# Patient Record
Sex: Female | Born: 1970 | Hispanic: Yes | Smoking: Never smoker
Health system: Southern US, Community
[De-identification: ages and names within clinical notes are randomized; demographics above are authoritative.]

## PROBLEM LIST (undated history)

## (undated) DIAGNOSIS — L709 Acne, unspecified: Secondary | ICD-10-CM

## (undated) HISTORY — DX: Acne, unspecified: L70.9

## (undated) HISTORY — PX: SALPINGOOPHORECTOMY: SHX82

## (undated) HISTORY — PX: AUGMENTATION MAMMAPLASTY: SUR837

## (undated) HISTORY — PX: TONSILLECTOMY: SUR1361

---

## 2009-05-04 ENCOUNTER — Ambulatory Visit: Payer: Self-pay | Admitting: Otolaryngology

## 2009-06-23 ENCOUNTER — Ambulatory Visit: Payer: Self-pay | Admitting: Otolaryngology

## 2010-02-15 ENCOUNTER — Ambulatory Visit: Payer: Self-pay | Admitting: Obstetrics and Gynecology

## 2010-02-25 ENCOUNTER — Ambulatory Visit: Payer: Self-pay | Admitting: Obstetrics and Gynecology

## 2010-06-28 ENCOUNTER — Ambulatory Visit: Payer: Self-pay | Admitting: General Surgery

## 2010-06-29 LAB — PATHOLOGY REPORT

## 2010-07-03 HISTORY — PX: BREAST EXCISIONAL BIOPSY: SUR124

## 2010-12-20 ENCOUNTER — Ambulatory Visit: Payer: Self-pay | Admitting: Family Medicine

## 2011-03-08 ENCOUNTER — Ambulatory Visit: Payer: Self-pay | Admitting: General Surgery

## 2014-12-22 ENCOUNTER — Ambulatory Visit: Payer: 59 | Attending: Internal Medicine

## 2014-12-22 DIAGNOSIS — R0683 Snoring: Secondary | ICD-10-CM | POA: Insufficient documentation

## 2014-12-22 DIAGNOSIS — R0681 Apnea, not elsewhere classified: Secondary | ICD-10-CM | POA: Diagnosis not present

## 2014-12-22 DIAGNOSIS — R400 Somnolence: Secondary | ICD-10-CM | POA: Diagnosis present

## 2016-05-29 ENCOUNTER — Other Ambulatory Visit: Payer: Self-pay | Admitting: Obstetrics & Gynecology

## 2016-05-29 DIAGNOSIS — Z1231 Encounter for screening mammogram for malignant neoplasm of breast: Secondary | ICD-10-CM

## 2016-05-29 LAB — HM PAP SMEAR: HM PAP: NEGATIVE

## 2016-05-30 ENCOUNTER — Other Ambulatory Visit: Payer: Self-pay | Admitting: Obstetrics & Gynecology

## 2016-05-30 ENCOUNTER — Ambulatory Visit
Admission: RE | Admit: 2016-05-30 | Discharge: 2016-05-30 | Disposition: A | Discharge status: Home / Self Care | Payer: 59 | Source: Ambulatory Visit | Attending: Obstetrics & Gynecology | Admitting: Obstetrics & Gynecology

## 2016-05-30 DIAGNOSIS — Z1231 Encounter for screening mammogram for malignant neoplasm of breast: Secondary | ICD-10-CM | POA: Insufficient documentation

## 2016-06-05 ENCOUNTER — Other Ambulatory Visit: Payer: Self-pay | Admitting: *Deleted

## 2016-06-05 ENCOUNTER — Inpatient Hospital Stay
Admission: RE | Admit: 2016-06-05 | Discharge: 2016-06-05 | Disposition: A | Discharge status: Home / Self Care | Payer: Self-pay | Source: Ambulatory Visit | Attending: *Deleted | Admitting: *Deleted

## 2016-06-05 DIAGNOSIS — Z9289 Personal history of other medical treatment: Secondary | ICD-10-CM

## 2016-07-26 DIAGNOSIS — Z0000 Encounter for general adult medical examination without abnormal findings: Secondary | ICD-10-CM | POA: Diagnosis not present

## 2016-08-08 DIAGNOSIS — Z0000 Encounter for general adult medical examination without abnormal findings: Secondary | ICD-10-CM | POA: Diagnosis not present

## 2016-08-08 DIAGNOSIS — E781 Pure hyperglyceridemia: Secondary | ICD-10-CM | POA: Diagnosis not present

## 2016-08-08 DIAGNOSIS — M545 Low back pain: Secondary | ICD-10-CM | POA: Diagnosis not present

## 2016-08-08 DIAGNOSIS — R7303 Prediabetes: Secondary | ICD-10-CM | POA: Diagnosis not present

## 2016-09-19 DIAGNOSIS — H43311 Vitreous membranes and strands, right eye: Secondary | ICD-10-CM | POA: Diagnosis not present

## 2016-09-19 DIAGNOSIS — H40003 Preglaucoma, unspecified, bilateral: Secondary | ICD-10-CM | POA: Diagnosis not present

## 2016-10-30 DIAGNOSIS — Z79899 Other long term (current) drug therapy: Secondary | ICD-10-CM | POA: Diagnosis not present

## 2016-10-30 DIAGNOSIS — L718 Other rosacea: Secondary | ICD-10-CM | POA: Diagnosis not present

## 2016-11-16 DIAGNOSIS — H6062 Unspecified chronic otitis externa, left ear: Secondary | ICD-10-CM | POA: Diagnosis not present

## 2016-11-16 DIAGNOSIS — J301 Allergic rhinitis due to pollen: Secondary | ICD-10-CM | POA: Diagnosis not present

## 2016-11-16 DIAGNOSIS — R0683 Snoring: Secondary | ICD-10-CM | POA: Diagnosis not present

## 2016-12-12 ENCOUNTER — Other Ambulatory Visit: Payer: Self-pay | Admitting: Obstetrics & Gynecology

## 2017-01-08 DIAGNOSIS — Z79899 Other long term (current) drug therapy: Secondary | ICD-10-CM | POA: Diagnosis not present

## 2017-01-08 DIAGNOSIS — L718 Other rosacea: Secondary | ICD-10-CM | POA: Diagnosis not present

## 2017-02-12 DIAGNOSIS — M545 Low back pain: Secondary | ICD-10-CM | POA: Diagnosis not present

## 2017-02-12 DIAGNOSIS — E781 Pure hyperglyceridemia: Secondary | ICD-10-CM | POA: Diagnosis not present

## 2017-02-12 DIAGNOSIS — R7303 Prediabetes: Secondary | ICD-10-CM | POA: Diagnosis not present

## 2017-05-29 DIAGNOSIS — M19072 Primary osteoarthritis, left ankle and foot: Secondary | ICD-10-CM | POA: Diagnosis not present

## 2017-05-29 DIAGNOSIS — M79672 Pain in left foot: Secondary | ICD-10-CM | POA: Diagnosis not present

## 2017-05-30 ENCOUNTER — Encounter: Payer: Self-pay | Admitting: Obstetrics & Gynecology

## 2017-05-30 ENCOUNTER — Ambulatory Visit (INDEPENDENT_AMBULATORY_CARE_PROVIDER_SITE_OTHER): Payer: 59 | Admitting: Obstetrics & Gynecology

## 2017-05-30 VITALS — BP 128/30 | HR 82 | Ht 66.0 in | Wt 203.0 lb

## 2017-05-30 DIAGNOSIS — Z23 Encounter for immunization: Secondary | ICD-10-CM

## 2017-05-30 DIAGNOSIS — Z1231 Encounter for screening mammogram for malignant neoplasm of breast: Secondary | ICD-10-CM

## 2017-05-30 DIAGNOSIS — Z124 Encounter for screening for malignant neoplasm of cervix: Secondary | ICD-10-CM

## 2017-05-30 DIAGNOSIS — N812 Incomplete uterovaginal prolapse: Secondary | ICD-10-CM

## 2017-05-30 DIAGNOSIS — Z1239 Encounter for other screening for malignant neoplasm of breast: Secondary | ICD-10-CM

## 2017-05-30 DIAGNOSIS — Z01419 Encounter for gynecological examination (general) (routine) without abnormal findings: Secondary | ICD-10-CM | POA: Diagnosis not present

## 2017-05-30 DIAGNOSIS — Z0000 Encounter for general adult medical examination without abnormal findings: Secondary | ICD-10-CM

## 2017-05-30 MED ORDER — YAZ 3-0.02 MG PO TABS: 1.0000 | ORAL_TABLET | Freq: Every day | ORAL | 3 refills | Status: DC

## 2017-05-30 NOTE — Patient Instructions (Signed)
PAP every three years Mammogram every year    Call [phone] to schedule at [facility] every 10 years after age 47 Labs yearly (with PCP) 

## 2017-05-30 NOTE — Addendum Note (Signed)
Addended by: Nadara MustardHARRIS, Essynce Munsch P on: 05/30/2017 02:58 PM   Modules accepted: Orders

## 2017-05-30 NOTE — Progress Notes (Signed)
HPI:      [name] is a 46 y.o. [phone] who LMP was No LMP recorded. Patient is not currently having periods (Reason: Oral contraceptives)., she presents today for her annual examination. The patient has no complaints today. The patient is sexually active. Her last pap: approximate date 2014 and was abnormal: ASCUS and last 3 PAPs have been normal and last mammogram: approximate date 2017 and was normal. The patient does perform self breast exams.  There is no notable family history of breast or ovarian cancer in her family.  The patient has regular exercise: yes.  The patient denies current symptoms of depression.    GYN History: Contraception: OCP (estrogen/progesterone)  PMHx: Past Medical History:  Diagnosis Date  . Acne   . Breast mass    Past Surgical History:  Procedure Laterality Date  . BREAST LUMPECTOMY    . SALPINGOOPHORECTOMY    . TONSILLECTOMY     Family History  Problem Relation Age of Onset  . Breast cancer Neg Hx    Social History   Tobacco Use  . Smoking status: Never Smoker  . Smokeless tobacco: Never Used  Substance Use Topics  . Alcohol use: No    Frequency: Never  . Drug use: No    Current Outpatient Medications:  .  YAZ 3-0.02 MG tablet, TAKE 1 TABLET BY MOUTH ONCE DAILY, Disp: 84 tablet, Rfl: 2 Allergies: Vicodin [hydrocodone-acetaminophen]  Review of Systems  Constitutional: Negative for chills, fever and malaise/fatigue.  HENT: Negative for congestion, sinus pain and sore throat.   Eyes: Negative for blurred vision and pain.  Respiratory: Negative for cough and wheezing.   Cardiovascular: Negative for chest pain and leg swelling.  Gastrointestinal: Negative for abdominal pain, constipation, diarrhea, heartburn, nausea and vomiting.  Genitourinary: Negative for dysuria, frequency, hematuria and urgency.  Musculoskeletal: Negative for back pain, joint pain, myalgias and neck pain.  Skin: Negative for itching and rash.  Neurological:  Negative for dizziness, tremors and weakness.  Endo/Heme/Allergies: Does not bruise/bleed easily.  Psychiatric/Behavioral: Negative for depression. The patient is not nervous/anxious and does not have insomnia.     Objective: BP (!) 128/30   Pulse 82   Ht 5\' 6"  (1.676 m)   Wt 203 lb (92.1 kg)   BMI 32.77 kg/m   Filed Weights   05/30/17 1417  Weight: 203 lb (92.1 kg)   Body mass index is 32.77 kg/m. Physical Exam  Constitutional: She is oriented to person, place, and time. She appears well-developed and well-nourished. No distress.  Genitourinary: Rectum normal, vagina normal and uterus normal. Pelvic exam was performed with patient supine. There is no rash or lesion on the right labia. There is no rash or lesion on the left labia. Vagina exhibits no lesion. No bleeding in the vagina. Right adnexum does not display mass and does not display tenderness. Left adnexum does not display mass and does not display tenderness. Cervix does not exhibit motion tenderness, lesion, friability or polyp.   Uterus is mobile and midaxial. Uterus is not enlarged or exhibiting a mass.  Genitourinary Comments: Gr 2 prolapse and Gr 2 cystocele  HENT:  Head: Normocephalic and atraumatic. Head is without laceration.  Right Ear: Hearing normal.  Left Ear: Hearing normal.  Nose: No epistaxis.  No foreign bodies.  Mouth/Throat: Uvula is midline, oropharynx is clear and moist and mucous membranes are normal.  Eyes: Pupils are equal, round, and reactive to light.  Neck: Normal range of motion. Neck supple.  No thyromegaly present.  Cardiovascular: Normal rate and regular rhythm. Exam reveals no gallop and no friction rub.  No murmur heard. Pulmonary/Chest: Effort normal and breath sounds normal. No respiratory distress. She has no wheezes. Right breast exhibits no mass, no skin change and no tenderness. Left breast exhibits no mass, no skin change and no tenderness.  Abdominal: Soft. Bowel sounds are normal. She  exhibits no distension. There is no tenderness. There is no rebound.  Musculoskeletal: Normal range of motion.  Neurological: She is alert and oriented to person, place, and time. No cranial nerve deficit.  Skin: Skin is warm and dry.  Psychiatric: She has a normal mood and affect. Judgment normal.  Vitals reviewed.  Assessment:  1. Annual physical exam   2. Screening for cervical cancer    Screening Plan:            1.  Cervical Screening-  Pap smear done today; prior ASCUS 2014  2. Breast screening- Exam annually and mammogram>40 planned   3. Colonoscopy every 10 years, Hemoccult testing - after age 47  4. Labs managed by PCP  5. Counseling for contraception: oral contraceptives (estrogen/progesterone)   6. Prolapse.  Monitor for sx's.  Surgery options dicussed    F/U  Return in about 1 year (around 05/30/2018) for Annual.  Annamarie MajorPaul Aldine Grainger, MD, Merlinda FrederickFACOG [facility], [facility] 05/30/2017  2:26 PM

## 2017-06-02 LAB — IGP, APTIMA HPV
HPV Aptima: POSITIVE — AB
PAP Smear Comment: 0

## 2017-06-03 ENCOUNTER — Encounter: Payer: Self-pay | Admitting: Obstetrics & Gynecology

## 2017-07-11 DIAGNOSIS — L918 Other hypertrophic disorders of the skin: Secondary | ICD-10-CM | POA: Diagnosis not present

## 2017-07-11 DIAGNOSIS — L719 Rosacea, unspecified: Secondary | ICD-10-CM | POA: Diagnosis not present

## 2017-08-01 DIAGNOSIS — E781 Pure hyperglyceridemia: Secondary | ICD-10-CM | POA: Diagnosis not present

## 2017-08-01 DIAGNOSIS — R7303 Prediabetes: Secondary | ICD-10-CM | POA: Diagnosis not present

## 2017-08-02 ENCOUNTER — Telehealth: Payer: Self-pay

## 2017-08-02 NOTE — Telephone Encounter (Signed)
Pt gets rx filled thru [facility], but she is headed out of town for the weekend & is inquiring if her rx can be sent to a local [facility]. CB#[phone]

## 2017-08-03 NOTE — Telephone Encounter (Signed)
LMVM for pt TRC w/information on which [facility] she wants [facility] to.

## 2017-08-03 NOTE — Telephone Encounter (Signed)
[facility] on [address]. Please

## 2017-08-15 ENCOUNTER — Other Ambulatory Visit: Payer: Self-pay | Admitting: Obstetrics & Gynecology

## 2017-08-15 ENCOUNTER — Encounter: Payer: Self-pay | Admitting: Obstetrics & Gynecology

## 2017-08-15 ENCOUNTER — Ambulatory Visit
Admission: RE | Admit: 2017-08-15 | Discharge: 2017-08-15 | Disposition: A | Discharge status: Home / Self Care | Payer: 59 | Source: Ambulatory Visit | Attending: Obstetrics & Gynecology | Admitting: Obstetrics & Gynecology

## 2017-08-15 DIAGNOSIS — Z1231 Encounter for screening mammogram for malignant neoplasm of breast: Secondary | ICD-10-CM | POA: Diagnosis not present

## 2017-08-15 DIAGNOSIS — E782 Mixed hyperlipidemia: Secondary | ICD-10-CM | POA: Diagnosis not present

## 2017-08-15 DIAGNOSIS — Z0000 Encounter for general adult medical examination without abnormal findings: Secondary | ICD-10-CM | POA: Diagnosis not present

## 2017-08-15 DIAGNOSIS — R7303 Prediabetes: Secondary | ICD-10-CM | POA: Diagnosis not present

## 2017-08-15 DIAGNOSIS — Z1239 Encounter for other screening for malignant neoplasm of breast: Secondary | ICD-10-CM

## 2017-12-24 DIAGNOSIS — E782 Mixed hyperlipidemia: Secondary | ICD-10-CM | POA: Diagnosis not present

## 2017-12-24 DIAGNOSIS — R7303 Prediabetes: Secondary | ICD-10-CM | POA: Diagnosis not present

## 2017-12-31 DIAGNOSIS — R7303 Prediabetes: Secondary | ICD-10-CM | POA: Diagnosis not present

## 2017-12-31 DIAGNOSIS — E782 Mixed hyperlipidemia: Secondary | ICD-10-CM | POA: Diagnosis not present

## 2018-01-21 DIAGNOSIS — L719 Rosacea, unspecified: Secondary | ICD-10-CM | POA: Diagnosis not present

## 2018-01-21 DIAGNOSIS — L918 Other hypertrophic disorders of the skin: Secondary | ICD-10-CM | POA: Diagnosis not present

## 2018-01-21 DIAGNOSIS — L720 Epidermal cyst: Secondary | ICD-10-CM | POA: Diagnosis not present

## 2018-02-07 DIAGNOSIS — R1110 Vomiting, unspecified: Secondary | ICD-10-CM | POA: Diagnosis not present

## 2018-04-02 DIAGNOSIS — Z23 Encounter for immunization: Secondary | ICD-10-CM | POA: Diagnosis not present

## 2018-04-02 DIAGNOSIS — H9201 Otalgia, right ear: Secondary | ICD-10-CM | POA: Diagnosis not present

## 2018-06-13 DIAGNOSIS — J019 Acute sinusitis, unspecified: Secondary | ICD-10-CM | POA: Diagnosis not present

## 2018-06-13 DIAGNOSIS — H6593 Unspecified nonsuppurative otitis media, bilateral: Secondary | ICD-10-CM | POA: Diagnosis not present

## 2018-06-13 DIAGNOSIS — R05 Cough: Secondary | ICD-10-CM | POA: Diagnosis not present

## 2018-06-24 ENCOUNTER — Encounter: Payer: Self-pay | Admitting: Obstetrics & Gynecology

## 2018-06-24 ENCOUNTER — Ambulatory Visit (INDEPENDENT_AMBULATORY_CARE_PROVIDER_SITE_OTHER): Payer: 59 | Admitting: Obstetrics & Gynecology

## 2018-06-24 ENCOUNTER — Other Ambulatory Visit (HOSPITAL_COMMUNITY)
Admission: RE | Admit: 2018-06-24 | Discharge: 2018-06-24 | Disposition: A | Discharge status: Home / Self Care | Payer: 59 | Source: Ambulatory Visit | Attending: Obstetrics & Gynecology | Admitting: Obstetrics & Gynecology

## 2018-06-24 ENCOUNTER — Ambulatory Visit: Payer: 59 | Admitting: Obstetrics & Gynecology

## 2018-06-24 VITALS — BP 120/80 | Ht 66.0 in | Wt 200.0 lb

## 2018-06-24 DIAGNOSIS — R7303 Prediabetes: Secondary | ICD-10-CM | POA: Diagnosis not present

## 2018-06-24 DIAGNOSIS — N812 Incomplete uterovaginal prolapse: Secondary | ICD-10-CM

## 2018-06-24 DIAGNOSIS — Z124 Encounter for screening for malignant neoplasm of cervix: Secondary | ICD-10-CM | POA: Insufficient documentation

## 2018-06-24 DIAGNOSIS — Z01419 Encounter for gynecological examination (general) (routine) without abnormal findings: Secondary | ICD-10-CM | POA: Diagnosis not present

## 2018-06-24 DIAGNOSIS — Z1239 Encounter for other screening for malignant neoplasm of breast: Secondary | ICD-10-CM

## 2018-06-24 DIAGNOSIS — E782 Mixed hyperlipidemia: Secondary | ICD-10-CM | POA: Diagnosis not present

## 2018-06-24 MED ORDER — YAZ 3-0.02 MG PO TABS: 1.0000 | ORAL_TABLET | Freq: Every day | ORAL | 3 refills | Status: DC

## 2018-06-24 NOTE — Patient Instructions (Signed)
PAP every year Mammogram every year    Call [phone] to schedule at [facility] every 10 years after age 48 Labs yearly (with PCP)   

## 2018-06-24 NOTE — Progress Notes (Signed)
HPI:      [name] is a 47 y.o. [id number] who has no LMP while on Oral contraceptives, she presents today for her annual examination. The patient has no complaints today. The patient is sexually active. Her last pap: approximate date 2018 and was normal and has h/o ASCUS and last mammogram: approximate date 2019 (Feb) and was normal. The patient does perform self breast exams.  There is no notable family history of breast or ovarian cancer in her family.  The patient has regular exercise: yes.  The patient denies current symptoms of depression.    GYN History: Contraception: OCP (estrogen/progesterone)  PMHx: Past Medical History:  Diagnosis Date  . Acne    Past Surgical History:  Procedure Laterality Date  . SALPINGOOPHORECTOMY    . TONSILLECTOMY     Family History  Problem Relation Age of Onset  . Breast cancer Maternal Aunt [phone]   Social History   Tobacco Use  . Smoking status: Never Smoker  . Smokeless tobacco: Never Used  Substance Use Topics  . Alcohol use: No    Frequency: Never  . Drug use: No    Current Outpatient Medications:  .  fenofibrate 160 MG tablet, Take by mouth., Disp: , Rfl:  .  YAZ 3-0.02 MG tablet, Take 1 tablet by mouth daily., Disp: 84 tablet, Rfl: 3 Allergies: Vicodin [hydrocodone-acetaminophen]  Review of Systems  Constitutional: Negative for chills, fever and malaise/fatigue.  HENT: Negative for congestion, sinus pain and sore throat.   Eyes: Negative for blurred vision and pain.  Respiratory: Negative for cough and wheezing.   Cardiovascular: Negative for chest pain and leg swelling.  Gastrointestinal: Negative for abdominal pain, constipation, diarrhea, heartburn, nausea and vomiting.  Genitourinary: Negative for dysuria, frequency, hematuria and urgency.  Musculoskeletal: Negative for back pain, joint pain, myalgias and neck pain.  Skin: Negative for itching and rash.  Neurological: Negative for dizziness, tremors and weakness.    Endo/Heme/Allergies: Does not bruise/bleed easily.  Psychiatric/Behavioral: Negative for depression. The patient is not nervous/anxious and does not have insomnia.    Objective: BP 120/80   Ht 5\' 6"  (1.676 m)   Wt 200 lb (90.7 kg)   BMI 32.28 kg/m   Filed Weights   06/24/18 0804  Weight: 200 lb (90.7 kg)   Body mass index is 32.28 kg/m. Physical Exam Constitutional:      General: She is not in acute distress.    Appearance: She is well-developed.  Genitourinary:     Pelvic exam was performed with patient supine.     Vagina, uterus and rectum normal.     No lesions in the vagina.     No vaginal bleeding.     No cervical motion tenderness, friability, lesion or polyp.     Uterus is mobile.     Uterus is not enlarged.     No uterine mass detected.    Uterus is midaxial.     No right or left adnexal mass present.     Right adnexa not tender.     Left adnexa not tender.     Genitourinary Comments: Gr 2 POP, Gr 1 cystocele  HENT:     Head: Normocephalic and atraumatic. No laceration.     Right Ear: Hearing normal.     Left Ear: Hearing normal.     Mouth/Throat:     Pharynx: Uvula midline.  Eyes:     Pupils: Pupils are equal, round, and reactive to light.  Neck:  Musculoskeletal: Normal range of motion and neck supple.     Thyroid: No thyromegaly.  Cardiovascular:     Rate and Rhythm: Normal rate and regular rhythm.     Heart sounds: No murmur. No friction rub. No gallop.   Pulmonary:     Effort: Pulmonary effort is normal. No respiratory distress.     Breath sounds: Normal breath sounds. No wheezing.  Chest:     Breasts:        Right: No mass, skin change or tenderness.        Left: No mass, skin change or tenderness.  Abdominal:     General: Bowel sounds are normal. There is no distension.     Palpations: Abdomen is soft.     Tenderness: There is no abdominal tenderness. There is no rebound.  Musculoskeletal: Normal range of motion.  Neurological:     Mental  Status: She is alert and oriented to person, place, and time.     Cranial Nerves: No cranial nerve deficit.  Skin:    General: Skin is warm and dry.  Psychiatric:        Judgment: Judgment normal.  Vitals signs reviewed.   Assessment:  ANNUAL EXAM 1. Women's annual routine gynecological examination   2. Screening for cervical cancer   3. Screening for breast cancer   4. Uterovaginal prolapse, incomplete    Screening Plan:            1.  Cervical Screening-  Pap smear done today  2. Breast screening- Exam annually and mammogram>40 planned   3. Colonoscopy every 10 years, Hemoccult testing - after age 48  4. Labs managed by PCP  5. Counseling for contraception: oral contraceptives (estrogen/progesterone)   6. Uterovaginal prolapse, incomplete    F/U  Return in about 1 year (around 06/25/2019) for Annual.  Annamarie MajorPaul Baljit Liebert, MD, Merlinda FrederickFACOG [facility], [facility] 06/24/2018  8:14 AM

## 2018-07-01 LAB — CYTOLOGY - PAP

## 2018-07-01 NOTE — Progress Notes (Signed)
D/w patient.  Colpo arranged.

## 2018-07-01 NOTE — Progress Notes (Signed)
Sch Colpo, pt aware (w PH)

## 2018-07-04 DIAGNOSIS — E1169 Type 2 diabetes mellitus with other specified complication: Secondary | ICD-10-CM | POA: Diagnosis not present

## 2018-07-04 DIAGNOSIS — E6609 Other obesity due to excess calories: Secondary | ICD-10-CM | POA: Diagnosis not present

## 2018-07-04 DIAGNOSIS — E782 Mixed hyperlipidemia: Secondary | ICD-10-CM | POA: Diagnosis not present

## 2018-07-22 ENCOUNTER — Ambulatory Visit: Payer: 59 | Admitting: Obstetrics & Gynecology

## 2018-07-22 ENCOUNTER — Encounter: Payer: Self-pay | Admitting: Obstetrics & Gynecology

## 2018-07-22 ENCOUNTER — Ambulatory Visit (INDEPENDENT_AMBULATORY_CARE_PROVIDER_SITE_OTHER): Payer: 59 | Admitting: Obstetrics & Gynecology

## 2018-07-22 ENCOUNTER — Other Ambulatory Visit (HOSPITAL_COMMUNITY)
Admission: RE | Admit: 2018-07-22 | Discharge: 2018-07-22 | Disposition: A | Discharge status: Home / Self Care | Payer: 59 | Source: Ambulatory Visit | Attending: Obstetrics & Gynecology | Admitting: Obstetrics & Gynecology

## 2018-07-22 VITALS — BP 100/70 | Ht 65.5 in | Wt 200.0 lb

## 2018-07-22 DIAGNOSIS — N879 Dysplasia of cervix uteri, unspecified: Secondary | ICD-10-CM | POA: Diagnosis not present

## 2018-07-22 DIAGNOSIS — R87612 Low grade squamous intraepithelial lesion on cytologic smear of cervix (LGSIL): Secondary | ICD-10-CM

## 2018-07-22 DIAGNOSIS — N870 Mild cervical dysplasia: Secondary | ICD-10-CM

## 2018-07-22 NOTE — Progress Notes (Signed)
HPI:  Tara Landry is a 48 y.o.  [phone]  who presents today for evaluation and management of abnormal cervical cytology.    Dysplasia History:  LGSIL recent PAP    Prior ASCUS 2018    PAP normal 08/2017  ROS:  Pertinent items are noted in HPI.  OB History  Gravida Para Term Preterm AB Living  2 2 2     2   SAB TAB Ectopic Multiple Live Births               # Outcome Date GA Lbr Len/2nd Weight Sex Delivery Anes PTL Lv  2 Term           1 Term            Past Medical History:  Diagnosis Date  . Acne    Past Surgical History:  Procedure Laterality Date  . SALPINGOOPHORECTOMY    . TONSILLECTOMY     SOCIAL HISTORY: Social History   Substance and Sexual Activity  Alcohol Use No  . Frequency: Never   Social History   Substance and Sexual Activity  Drug Use No   Family History  Problem Relation Age of Onset  . Breast cancer Maternal Aunt 60   ALLERGIES:  Vicodin [hydrocodone-acetaminophen]  Current Outpatient Medications on File Prior to Visit  Medication Sig Dispense Refill  . fenofibrate 160 MG tablet Take by mouth.    Marland Kitchen. YAZ 3-0.02 MG tablet Take 1 tablet by mouth daily. 84 tablet 3   No current facility-administered medications on file prior to visit.    Physical Exam: -Vitals:  BP 100/70   Ht 5' 5.5" (1.664 m)   Wt 200 lb (90.7 kg)   BMI 32.78 kg/m  GEN: WD, WN, NAD.  A+ O x 3, good mood and affect. ABD:  NT, ND.  Soft, no masses.  No hernias noted.   Pelvic:   Vulva: Normal appearance.  No lesions.  Vagina: No lesions or abnormalities noted.  Support: Normal pelvic support.  Urethra No masses tenderness or scarring.  Meatus Normal size without lesions or prolapse.  Cervix: See below.  Anus: Normal exam.  No lesions.  Perineum: Normal exam.  No lesions.        Bimanual   Uterus: Normal size.  Non-tender.  Mobile.  AV.  Adnexae: No masses.  Non-tender to palpation.  Cul-de-sac: Negative for abnormality.  Physical Exam Genitourinary:         PROCEDURE: 1.  Urine Pregnancy Test:  not done 2.  Colposcopy performed with 4% acetic acid after verbal consent obtained                                        -Aceto-white Lesions Location(s): 1-3 o'clock.              -Biopsy performed at 1,9 o'clock               -ECC indicated and performed: Yes.       -Biopsy sites made hemostatic with pressure, AgNO3, and/or Monsel's solution   -Satisfactory colposcopy: Yes.      -Evidence of Invasive cervical CA :  NO  ASSESSMENT:  Tara Landry is a 48 y.o. [id number] here for  1. LGSIL on Pap smear of cervix    PLAN: 1.  I discussed the grading system of pap smears and HPV high risk viral types.  We will discuss and base management after colpo results return. 2. Follow up PAP 6 months, vs intervention if high grade dysplasia identified 3. Treatment of persistantly abnormal PAP smears and cervical dysplasia, even mild, is discussed w pt today in detail, as well as the pros and cons of Cryo and LEEP procedures. Will consider and discuss after results.      Tara Major, MD, [facility] Frederick Ob[facility], [facility] 07/22/2018  10:51 AM

## 2018-07-22 NOTE — Addendum Note (Signed)
Addended by: Nadara Mustard on: 07/22/2018 02:51 PM   Modules accepted: Orders

## 2018-07-22 NOTE — Patient Instructions (Signed)
Colposcopy, Care After  This sheet gives you information about how to care for yourself after your procedure. Your health care provider may also give you more specific instructions. If you have problems or questions, contact your health care provider.  What can I expect after the procedure?  If you had a colposcopy without a biopsy, you can expect to feel fine right away, but you may have some spotting for a few days. You can go back to your normal activities.  If you had a colposcopy with a biopsy, it is common to have:   Soreness and pain. This may last for a few days.   Light-headedness.   Mild vaginal bleeding or dark-colored, grainy discharge. This may last for a few days. The discharge may be due to a solution that was used during the procedure. You may need to wear a sanitary pad during this time.   Spotting for at least 48 hours after the procedure.  Follow these instructions at home:     Take over-the-counter and prescription medicines only as told by your health care provider. Talk with your health care provider about what type of over-the-counter pain medicine and prescription medicine you can start taking again. It is especially important to talk with your health care provider if you take blood-thinning medicine.   Do not drive or use heavy machinery while taking prescription pain medicine.   For at least 3 days after your procedure, or as long as told by your health care provider, avoid:  ? Douching.  ? Using tampons.  ? Having sexual intercourse.   Continue to use birth control (contraception).   Limit your physical activity for the first day after the procedure as told by your health care provider. Ask your health care provider what activities are safe for you.   It is up to you to get the results of your procedure. Ask your health care provider, or the department performing the procedure, when your results will be ready.   Keep all follow-up visits as told by your health care provider.  This is important.  Contact a health care provider if:   You develop a skin rash.  Get help right away if:   You are bleeding heavily from your vagina or you are passing blood clots. This includes using more than one sanitary pad per hour for 2 hours in a row.   You have a fever or chills.   You have pelvic pain.   You have abnormal, yellow-colored, or bad-smelling vaginal discharge. This could be a sign of infection.   You have severe pain or cramps in your lower abdomen that do not get better with medicine.   You feel light-headed or dizzy, or you faint.  Summary   If you had a colposcopy without a biopsy, you can expect to feel fine right away, but you may have some spotting for a few days. You can go back to your normal activities.   If you had a colposcopy with a biopsy, you may notice mild pain and spotting for 48 hours after the procedure.   Avoid douching, using tampons, and having sexual intercourse for 3 days after the procedure or as long as told by your health care provider.   Contact your health care provider if you have bleeding, severe pain, or signs of infection.  This information is not intended to replace advice given to you by your health care provider. Make sure you discuss any questions you have with your   health care provider.  Document Released: 04/09/2013 Document Revised: 02/04/2016 Document Reviewed: 02/04/2016  Elsevier Interactive Patient Education  2019 [employer]

## 2018-07-29 ENCOUNTER — Telehealth: Payer: Self-pay

## 2018-07-29 NOTE — Telephone Encounter (Signed)
Pt calling for results, I advised pt you would call when you got the results

## 2018-08-16 ENCOUNTER — Ambulatory Visit
Admission: RE | Admit: 2018-08-16 | Discharge: 2018-08-16 | Disposition: A | Discharge status: Home / Self Care | Payer: 59 | Source: Ambulatory Visit | Attending: Obstetrics & Gynecology | Admitting: Obstetrics & Gynecology

## 2018-08-16 ENCOUNTER — Encounter: Payer: Self-pay | Admitting: Obstetrics & Gynecology

## 2018-08-16 DIAGNOSIS — Z1239 Encounter for other screening for malignant neoplasm of breast: Secondary | ICD-10-CM

## 2018-08-16 DIAGNOSIS — Z1231 Encounter for screening mammogram for malignant neoplasm of breast: Secondary | ICD-10-CM | POA: Insufficient documentation

## 2018-10-03 DIAGNOSIS — E785 Hyperlipidemia, unspecified: Secondary | ICD-10-CM | POA: Diagnosis not present

## 2018-10-03 DIAGNOSIS — E1169 Type 2 diabetes mellitus with other specified complication: Secondary | ICD-10-CM | POA: Diagnosis not present

## 2018-10-03 DIAGNOSIS — E782 Mixed hyperlipidemia: Secondary | ICD-10-CM | POA: Diagnosis not present

## 2018-10-09 DIAGNOSIS — E782 Mixed hyperlipidemia: Secondary | ICD-10-CM | POA: Diagnosis not present

## 2018-10-09 DIAGNOSIS — Z0000 Encounter for general adult medical examination without abnormal findings: Secondary | ICD-10-CM | POA: Diagnosis not present

## 2018-10-09 DIAGNOSIS — E1169 Type 2 diabetes mellitus with other specified complication: Secondary | ICD-10-CM | POA: Diagnosis not present

## 2018-10-09 DIAGNOSIS — Z23 Encounter for immunization: Secondary | ICD-10-CM | POA: Diagnosis not present

## 2019-01-20 ENCOUNTER — Ambulatory Visit: Payer: 59 | Admitting: Obstetrics & Gynecology

## 2019-01-30 ENCOUNTER — Encounter: Payer: Self-pay | Admitting: Obstetrics & Gynecology

## 2019-01-30 ENCOUNTER — Other Ambulatory Visit (HOSPITAL_COMMUNITY)
Admission: RE | Admit: 2019-01-30 | Discharge: 2019-01-30 | Disposition: A | Discharge status: Home / Self Care | Payer: 59 | Source: Ambulatory Visit | Attending: Obstetrics & Gynecology | Admitting: Obstetrics & Gynecology

## 2019-01-30 ENCOUNTER — Ambulatory Visit (INDEPENDENT_AMBULATORY_CARE_PROVIDER_SITE_OTHER): Payer: 59 | Admitting: Obstetrics & Gynecology

## 2019-01-30 ENCOUNTER — Ambulatory Visit: Payer: 59 | Admitting: Obstetrics & Gynecology

## 2019-01-30 ENCOUNTER — Other Ambulatory Visit: Payer: Self-pay

## 2019-01-30 VITALS — BP 130/80 | Ht 65.0 in | Wt 205.0 lb

## 2019-01-30 DIAGNOSIS — N814 Uterovaginal prolapse, unspecified: Secondary | ICD-10-CM | POA: Diagnosis not present

## 2019-01-30 DIAGNOSIS — N870 Mild cervical dysplasia: Secondary | ICD-10-CM | POA: Insufficient documentation

## 2019-01-30 NOTE — Progress Notes (Signed)
  HPI:  Patient is a 48 y.o. [id number] presenting for follow up evaluation of abnormal PAP smear in the past.  Her last PAP was 7 months ago and was abnormal: LGSIL. She has had a prior colposcopy. Prior biopsies (if done) were CIN I.  PMHx: She  has a past medical history of Acne. Also,  has a past surgical history that includes Salpingoophorectomy and Tonsillectomy., family history includes Breast cancer (age of onset: 40) in her maternal aunt.,  reports that she has never smoked. She has never used smokeless tobacco. She reports that she does not drink alcohol or use drugs.  She has a current medication list which includes the following prescription(s): fenofibrate and yaz.  Also, is allergic to vicodin [hydrocodone-acetaminophen].  Review of Systems  All other systems reviewed and are negative.   Objective: BP 130/80   Ht 5\' 5"  (1.651 m)   Wt 205 lb (93 kg)   BMI 34.11 kg/m  Filed Weights   01/30/19 1016  Weight: 205 lb (93 kg)   Body mass index is 34.11 kg/m.  Physical examination Physical Exam Constitutional:      General: She is not in acute distress.    Appearance: She is well-developed.  Genitourinary:     Pelvic exam was performed with patient supine.     Vagina and uterus normal.     No vaginal erythema or bleeding.     No cervical motion tenderness, discharge, polyp or nabothian cyst.     Uterus is mobile.     Uterus is not enlarged.     No uterine mass detected.    Uterus is midaxial.     No right or left adnexal mass present.     Right adnexa not tender.     Left adnexa not tender.     Genitourinary Comments: Gr 2 POP and cystocele noted Small uterus  HENT:     Head: Normocephalic and atraumatic.     Nose: Nose normal.  Abdominal:     General: There is no distension.     Palpations: Abdomen is soft.     Tenderness: There is no abdominal tenderness.  Musculoskeletal: Normal range of motion.  Neurological:     Mental Status: She is alert and oriented to  person, place, and time.     Cranial Nerves: No cranial nerve deficit.  Skin:    General: Skin is warm and dry.    ASSESSMENT:  History of Cervical Dysplasia CIN I Also, Uterine prolapse and Cystocele  Plan:  1.  I discussed the grading system of pap smears and HPV high risk viral types.   2. Follow up PAP 6 months, vs intervention if high grade dysplasia identified. 3. Treatment of persistantly abnormal PAP smears and cervical dysplasia, even mild, is discussed w pt today in detail, as well as the pros and cons of Cryo and LEEP procedures. Will consider and discuss after results. 4. Treatment options for pelvic organ prolapse discussed today, based on future degree of sx's.  A total of 15 minutes were spent face-to-face with the patient during this encounter and over half of that time dealt with counseling and coordination of care.  Barnett Applebaum, MD, [facility], [facility] 01/30/2019  10:19 AM

## 2019-02-03 ENCOUNTER — Other Ambulatory Visit: Payer: Self-pay | Admitting: Obstetrics & Gynecology

## 2019-02-03 LAB — CYTOLOGY - PAP

## 2019-02-03 NOTE — Progress Notes (Signed)
PAP LGSIL D/w pt F/u PAP 6 mos If persistant, option for treatment discussed  Barnett Applebaum, MD, [facility], [facility] 02/03/2019  1:55 PM

## 2019-05-22 ENCOUNTER — Other Ambulatory Visit: Payer: Self-pay

## 2019-05-22 MED ORDER — YAZ 3-0.02 MG PO TABS: 1.0000 | ORAL_TABLET | Freq: Every day | ORAL | 0 refills | Status: DC

## 2019-05-22 NOTE — Telephone Encounter (Signed)
Pt calling; has appt sched 12/31; needs refill of Yaz.  [phone] Pt states she will run out before appt.  Adv I will send in refill which I did.

## 2019-07-03 ENCOUNTER — Ambulatory Visit (INDEPENDENT_AMBULATORY_CARE_PROVIDER_SITE_OTHER): Payer: 59 | Admitting: Obstetrics & Gynecology

## 2019-07-03 ENCOUNTER — Encounter: Payer: Self-pay | Admitting: Obstetrics & Gynecology

## 2019-07-03 ENCOUNTER — Other Ambulatory Visit (HOSPITAL_COMMUNITY)
Admission: RE | Admit: 2019-07-03 | Discharge: 2019-07-03 | Disposition: A | Discharge status: Home / Self Care | Payer: 59 | Source: Ambulatory Visit | Attending: Obstetrics & Gynecology | Admitting: Obstetrics & Gynecology

## 2019-07-03 ENCOUNTER — Other Ambulatory Visit: Payer: Self-pay

## 2019-07-03 VITALS — BP 120/80 | Ht 66.0 in | Wt 206.0 lb

## 2019-07-03 DIAGNOSIS — Z1231 Encounter for screening mammogram for malignant neoplasm of breast: Secondary | ICD-10-CM

## 2019-07-03 DIAGNOSIS — N870 Mild cervical dysplasia: Secondary | ICD-10-CM | POA: Diagnosis not present

## 2019-07-03 DIAGNOSIS — Z01419 Encounter for gynecological examination (general) (routine) without abnormal findings: Secondary | ICD-10-CM | POA: Diagnosis not present

## 2019-07-03 DIAGNOSIS — N814 Uterovaginal prolapse, unspecified: Secondary | ICD-10-CM

## 2019-07-03 MED ORDER — YAZ 3-0.02 MG PO TABS: 1.0000 | ORAL_TABLET | Freq: Every day | ORAL | 3 refills | Status: DC

## 2019-07-03 NOTE — Progress Notes (Signed)
HPI:      [name] is a 48 y.o. [id number] who LMP was No LMP recorded. (Menstrual status: Oral contraceptives)., she presents today for her annual examination. The patient has no complaints today. The patient is sexually active. Her last pap: approximate date 2020 (July) and was normal and prior LGSIL or CIN I on multiple occasions since 2014 and last mammogram: approximate date 2019 and was normal. The patient does perform self breast exams.  There is no notable family history of breast or ovarian cancer in her family.  The patient has regular exercise: yes.  The patient denies current symptoms of depression.    GYN History: Contraception: OCP (estrogen/progesterone)  PMHx: Past Medical History:  Diagnosis Date  . Acne    Past Surgical History:  Procedure Laterality Date  . SALPINGOOPHORECTOMY    . TONSILLECTOMY     Family History  Problem Relation Age of Onset  . Breast cancer Maternal Aunt 36   Social History   Tobacco Use  . Smoking status: Never Smoker  . Smokeless tobacco: Never Used  Substance Use Topics  . Alcohol use: No  . Drug use: No    Current Outpatient Medications:  .  fenofibrate 160 MG tablet, Take by mouth., Disp: , Rfl:  .  YAZ 3-0.02 MG tablet, Take 1 tablet by mouth daily., Disp: 84 tablet, Rfl: 3 .  fenofibrate 160 MG tablet, Take by mouth., Disp: , Rfl:  Allergies: Vicodin [hydrocodone-acetaminophen]  Review of Systems  Constitutional: Negative for chills, fever and malaise/fatigue.  HENT: Negative for congestion, sinus pain and sore throat.   Eyes: Negative for blurred vision and pain.  Respiratory: Negative for cough and wheezing.   Cardiovascular: Negative for chest pain and leg swelling.  Gastrointestinal: Negative for abdominal pain, constipation, diarrhea, heartburn, nausea and vomiting.  Genitourinary: Negative for dysuria, frequency, hematuria and urgency.  Musculoskeletal: Negative for back pain, joint pain, myalgias and neck pain.   Skin: Negative for itching and rash.  Neurological: Negative for dizziness, tremors and weakness.  Endo/Heme/Allergies: Does not bruise/bleed easily.  Psychiatric/Behavioral: Negative for depression. The patient is not nervous/anxious and does not have insomnia.     Objective: BP 120/80   Ht 5\' 6"  (1.676 m)   Wt 206 lb (93.4 kg)   BMI 33.25 kg/m   Filed Weights   07/03/19 1041  Weight: 206 lb (93.4 kg)   Body mass index is 33.25 kg/m. Physical Exam Constitutional:      General: She is not in acute distress.    Appearance: She is well-developed.  Genitourinary:     Pelvic exam was performed with patient supine.     Vagina, uterus and rectum normal.     No lesions in the vagina.     No vaginal bleeding.     No cervical motion tenderness, friability, lesion or polyp.     Uterus is mobile.     Uterus is not enlarged.     No uterine mass detected.    Uterus is midaxial.     No right or left adnexal mass present.     Right adnexa not tender.     Left adnexa not tender.     Genitourinary Comments: Gr 2 POP and Cystocele  HENT:     Head: Normocephalic and atraumatic. No laceration.     Right Ear: Hearing normal.     Left Ear: Hearing normal.     Mouth/Throat:     Pharynx: Uvula midline.  Eyes:     Pupils: Pupils are equal, round, and reactive to light.  Neck:     Thyroid: No thyromegaly.  Cardiovascular:     Rate and Rhythm: Normal rate and regular rhythm.     Heart sounds: No murmur. No friction rub. No gallop.   Pulmonary:     Effort: Pulmonary effort is normal. No respiratory distress.     Breath sounds: Normal breath sounds. No wheezing.  Chest:     Breasts:        Right: No mass, skin change or tenderness.        Left: No mass, skin change or tenderness.  Abdominal:     General: Bowel sounds are normal. There is no distension.     Palpations: Abdomen is soft.     Tenderness: There is no abdominal tenderness. There is no rebound.  Musculoskeletal:         General: Normal range of motion.     Cervical back: Normal range of motion and neck supple.  Neurological:     Mental Status: She is alert and oriented to person, place, and time.     Cranial Nerves: No cranial nerve deficit.  Skin:    General: Skin is warm and dry.  Psychiatric:        Judgment: Judgment normal.  Vitals reviewed.     Assessment:  ANNUAL EXAM 1. Women's annual routine gynecological examination   2. CIN I (cervical intraepithelial neoplasia I)   3. Uterine prolapse   4. Encounter for screening mammogram for malignant neoplasm of breast      Screening Plan:            1.  Cervical Screening-  Pap smear done today Again in 6 mos Consider CRYO to alleviate recurrent CIN I or LGSIL for years now  2. Breast screening- Exam annually and mammogram>40 planned   3. Colonoscopy every 10 years, Hemoccult testing - after age 7 (next year)  4. Labs managed by PCP  5. Counseling for contraception: oral contraceptives (estrogen/progesterone)   6. CIN I (cervical intraepithelial neoplasia I) - Cytology - PAP   7. Uterine prolapse - Options for therapy if sx's develop discussed    F/U  Return in about 6 months (around 12/31/2019) for Follow up PAP.  Annamarie Major, MD, [facility], [facility] 07/03/2019  11:02 AM

## 2019-07-03 NOTE — Patient Instructions (Signed)
PAP every 6 months  Mammogram every year due Feb 2021    Call [phone] to schedule at [facility] Colonoscopy every 10 years starting next year Labs yearly (with PCP)

## 2019-07-09 ENCOUNTER — Telehealth: Payer: Self-pay | Admitting: Obstetrics & Gynecology

## 2019-07-09 LAB — CYTOLOGY - PAP

## 2019-07-09 NOTE — Telephone Encounter (Signed)
-----   Message from Nadara Mustard, MD sent at 07/09/2019 12:00 PM EST ----- Discussed results w py. Please schedule CRYO CERVIX procedure for PH soon.

## 2019-07-09 NOTE — Telephone Encounter (Signed)
Patient is schedule for 07/23/19

## 2019-07-09 NOTE — Progress Notes (Signed)
Discussed results w py. Please schedule CRYO CERVIX procedure for PH soon.

## 2019-07-21 ENCOUNTER — Other Ambulatory Visit: Payer: Self-pay | Admitting: Obstetrics & Gynecology

## 2019-07-21 DIAGNOSIS — Z1231 Encounter for screening mammogram for malignant neoplasm of breast: Secondary | ICD-10-CM

## 2019-07-23 ENCOUNTER — Ambulatory Visit: Payer: 59 | Admitting: Obstetrics & Gynecology

## 2019-08-14 ENCOUNTER — Encounter: Payer: Self-pay | Admitting: Obstetrics & Gynecology

## 2019-08-14 ENCOUNTER — Other Ambulatory Visit: Payer: Self-pay

## 2019-08-14 ENCOUNTER — Ambulatory Visit (INDEPENDENT_AMBULATORY_CARE_PROVIDER_SITE_OTHER): Payer: 59 | Admitting: Obstetrics & Gynecology

## 2019-08-14 VITALS — BP 130/80 | Ht 65.0 in | Wt 205.0 lb

## 2019-08-14 DIAGNOSIS — N870 Mild cervical dysplasia: Secondary | ICD-10-CM

## 2019-08-14 NOTE — Progress Notes (Signed)
   GYNECOLOGY CLINIC PROCEDURE NOTE  Cryotherapy details  Indication: Pt has a history of CIN 1. 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  She has had this on 2 subsequent PAPs since biopsy revealed CIN I on 07/2018.  First abn PAP 2018 (HPV), then LGSIL x3.  The indications for cryotherapy were reviewed with the patient in detail. She was counseled about that efficacy of this procedure, and possible need for excisional procedure in the future if her cervical dysplasia persists.  The risks of the procedure where explained in detail and patient was told to expect a copious amount of discharge in the next few weeks. All her questions were answered, and written informed consent was obtained.  The patient was placed in the dorsal lithotomy position and a vaginal speculum was placed. Her cervix was visualized and patient was noted to have had normal size transformation zone. The appropriate cryotherapy probes were picked and the more endocervical probe was then affixed to cryotherapy apparatus. Then, nitrogen gas was then activated, the probe was applied to the transformation zone of the cervix. This was kept in place for 2 minutes. The cryotherapy was then stopped and all instruments were removed from the patient's pelvis; a thawing period of 2 minutes was observed.  A second cycle of cryotherapy was then administered to the cervix with the more ectocervical probe for 2 minutes.  Then, the probe was removed.  The patient tolerated the procedure well without any complications. Routine post procedure instructions were given to the patient.  Will repeat pap smear in 6 months and manage accordingly.  Annamarie Major, MD, [facility], [facility] 08/14/2019  2:01 PM

## 2019-08-15 ENCOUNTER — Other Ambulatory Visit: Payer: Self-pay | Admitting: Obstetrics & Gynecology

## 2019-08-18 ENCOUNTER — Ambulatory Visit
Admission: RE | Admit: 2019-08-18 | Discharge: 2019-08-18 | Disposition: A | Discharge status: Home / Self Care | Payer: 59 | Source: Ambulatory Visit | Attending: Obstetrics & Gynecology | Admitting: Obstetrics & Gynecology

## 2019-08-18 DIAGNOSIS — Z1231 Encounter for screening mammogram for malignant neoplasm of breast: Secondary | ICD-10-CM | POA: Insufficient documentation

## 2019-08-19 ENCOUNTER — Encounter: Payer: Self-pay | Admitting: Obstetrics & Gynecology

## 2019-10-28 ENCOUNTER — Other Ambulatory Visit: Payer: Self-pay | Admitting: Obstetrics & Gynecology

## 2019-10-28 DIAGNOSIS — Z1231 Encounter for screening mammogram for malignant neoplasm of breast: Secondary | ICD-10-CM

## 2019-11-04 ENCOUNTER — Other Ambulatory Visit: Payer: Self-pay | Admitting: Obstetrics & Gynecology

## 2020-02-11 ENCOUNTER — Ambulatory Visit: Payer: 59 | Admitting: Obstetrics & Gynecology

## 2020-03-04 ENCOUNTER — Telehealth: Payer: Self-pay

## 2020-03-04 NOTE — Telephone Encounter (Signed)
Patient has apt 9/9, Birth control run out this Saturday. She resides in [place]. [facility] [address] [place], [place] [phone]

## 2020-03-05 MED ORDER — YAZ 3-0.02 MG PO TABS: 1.0000 | ORAL_TABLET | Freq: Every day | ORAL | 0 refills | Status: DC

## 2020-03-05 NOTE — Telephone Encounter (Signed)
Spoke w/patient to verify pharmacy (none listed for [place], [place]). Patient advised [place], [place]. Rf sent. Pt aware.

## 2020-03-11 ENCOUNTER — Encounter: Payer: Self-pay | Admitting: Obstetrics & Gynecology

## 2020-03-11 ENCOUNTER — Ambulatory Visit (INDEPENDENT_AMBULATORY_CARE_PROVIDER_SITE_OTHER): Payer: 59 | Admitting: Obstetrics & Gynecology

## 2020-03-11 ENCOUNTER — Other Ambulatory Visit: Payer: Self-pay

## 2020-03-11 ENCOUNTER — Other Ambulatory Visit (HOSPITAL_COMMUNITY)
Admission: RE | Admit: 2020-03-11 | Discharge: 2020-03-11 | Disposition: A | Discharge status: Home / Self Care | Payer: 59 | Source: Ambulatory Visit | Attending: Obstetrics & Gynecology | Admitting: Obstetrics & Gynecology

## 2020-03-11 VITALS — BP 100/60 | Ht 65.5 in | Wt 199.0 lb

## 2020-03-11 DIAGNOSIS — N870 Mild cervical dysplasia: Secondary | ICD-10-CM | POA: Diagnosis not present

## 2020-03-11 DIAGNOSIS — Z780 Asymptomatic menopausal state: Secondary | ICD-10-CM

## 2020-03-11 MED ORDER — YAZ 3-0.02 MG PO TABS: 1.0000 | ORAL_TABLET | Freq: Every day | ORAL | 1 refills | Status: DC

## 2020-03-11 NOTE — Progress Notes (Signed)
HPI:  Patient is a 49 y.o. [id number] presenting for follow up evaluation of abnormal PAP smear in the past.  Her last PAP was 9 months ago and was abnormal: LGSIL.  She then had Cryo to cervix due to recurrent CIN I.. She has had a prior colposcopy. Prior biopsies (if done) were CIN I.  PMHx: She  has a past medical history of Acne. Also,  has a past surgical history that includes Salpingoophorectomy; Tonsillectomy; Breast excisional biopsy (Left, 2012); and Augmentation mammaplasty., family history includes Breast cancer in her cousin and cousin; Breast cancer (age of onset: 67) in her maternal aunt.,  reports that she has never smoked. She has never used smokeless tobacco. She reports that she does not drink alcohol and does not use drugs.  She has a current medication list which includes the following prescription(s): fenofibrate, yaz, and fenofibrate. Also, is allergic to vicodin [hydrocodone-acetaminophen].  Review of Systems  All other systems reviewed and are negative.   Objective: BP 100/60    Ht 5' 5.5" (1.664 m)    Wt 199 lb (90.3 kg)    BMI 32.61 kg/m  Filed Weights   03/11/20 1631  Weight: 199 lb (90.3 kg)   Body mass index is 32.61 kg/m.  Physical examination Physical Exam Constitutional:      General: She is not in acute distress.    Appearance: She is well-developed.  Genitourinary:     Pelvic exam was performed with patient supine.     Vagina and uterus normal.     No vaginal erythema or bleeding.     No cervical motion tenderness, discharge, polyp or nabothian cyst.     Uterus is mobile.     Uterus is not enlarged.     No uterine mass detected.    Uterus is midaxial.     No right or left adnexal mass present.     Right adnexa not tender.     Left adnexa not tender.  HENT:     Head: Normocephalic and atraumatic.     Nose: Nose normal.  Abdominal:     General: There is no distension.     Palpations: Abdomen is soft.     Tenderness: There is no abdominal  tenderness.  Musculoskeletal:        General: Normal range of motion.  Neurological:     Mental Status: She is alert and oriented to person, place, and time.     Cranial Nerves: No cranial nerve deficit.  Skin:    General: Skin is warm and dry.  Psychiatric:        Attention and Perception: Attention normal.        Mood and Affect: Mood and affect normal.        Speech: Speech normal.        Behavior: Behavior normal.        Thought Content: Thought content normal.        Judgment: Judgment normal.     ASSESSMENT:  History of Cervical Dysplasia  Plan:  1.  I discussed the grading system of pap smears and HPV high risk viral types.   2. Follow up PAP 6 months, vs intervention if high grade dysplasia identified. 3. Once two normal PAP in a row, then can revert to annual testing  A total of 20 minutes were spent face-to-face with the patient as well as preparation, review, communication, and documentation during this encounter.    Annamarie Major, MD, [facility], [facility] 03/11/2020  4:46 PM

## 2020-03-16 LAB — CYTOLOGY - PAP: Diagnosis: NEGATIVE

## 2020-07-13 ENCOUNTER — Ambulatory Visit: Payer: 59 | Admitting: Obstetrics & Gynecology

## 2020-08-01 IMAGING — MG DIGITAL SCREENING BILATERAL MAMMOGRAM WITH TOMO AND CAD
8 series · 9 of 24 positions shown · non-contrast
Comparison: Previous exam(s).

CLINICAL DATA: Screening.

EXAM:
DIGITAL SCREENING BILATERAL MAMMOGRAM WITH TOMO AND CAD

[R CC synth-2D]
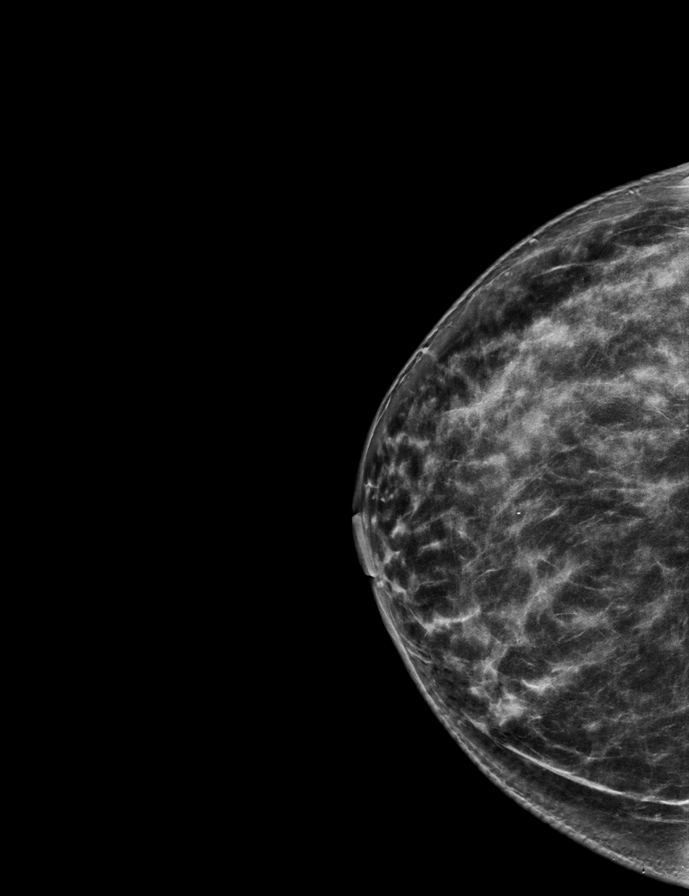

[L MLO synth-2D]
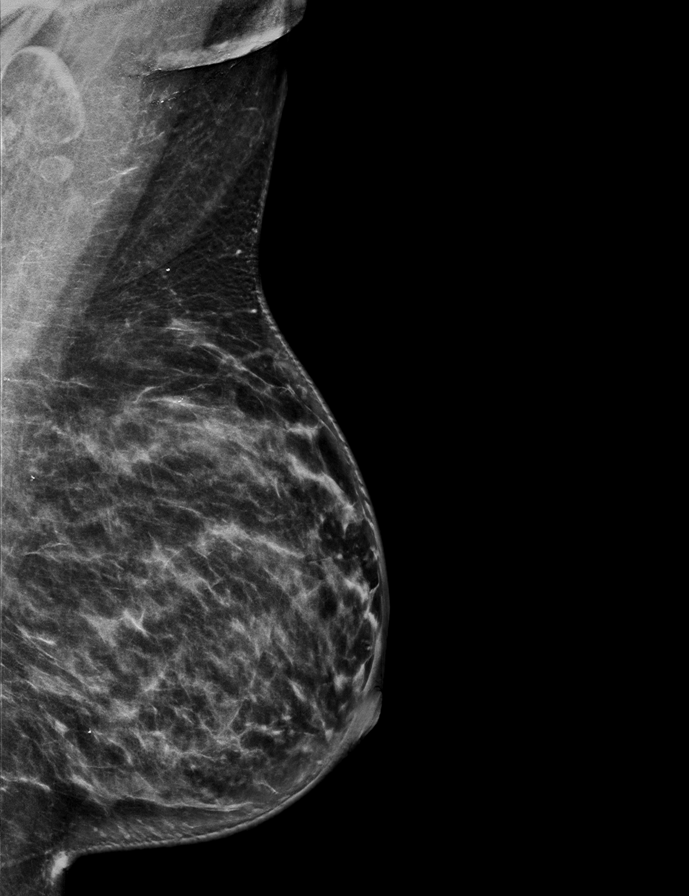

[R MLO synth-2D]
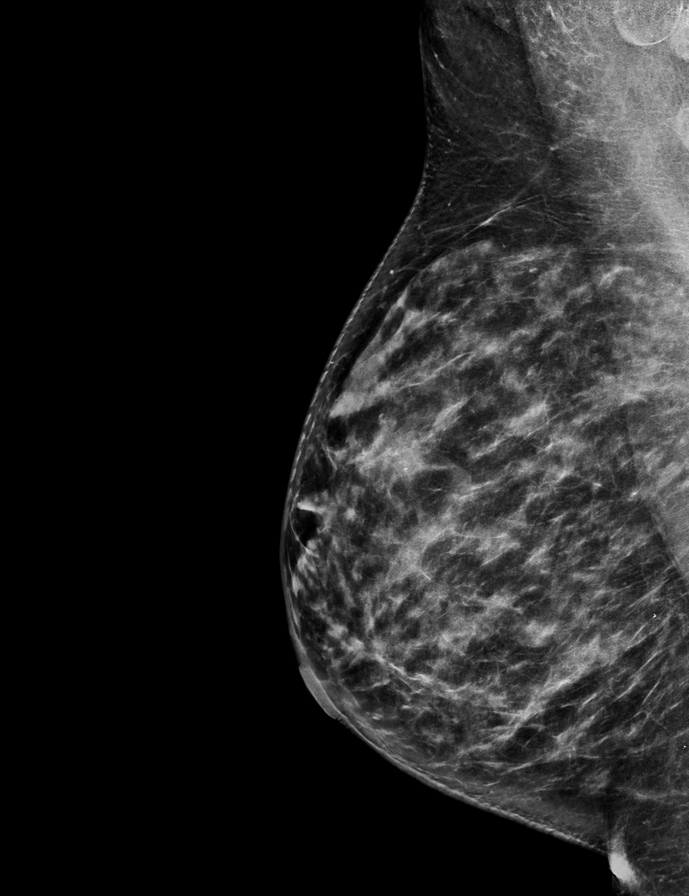

[L CC synth-2D]
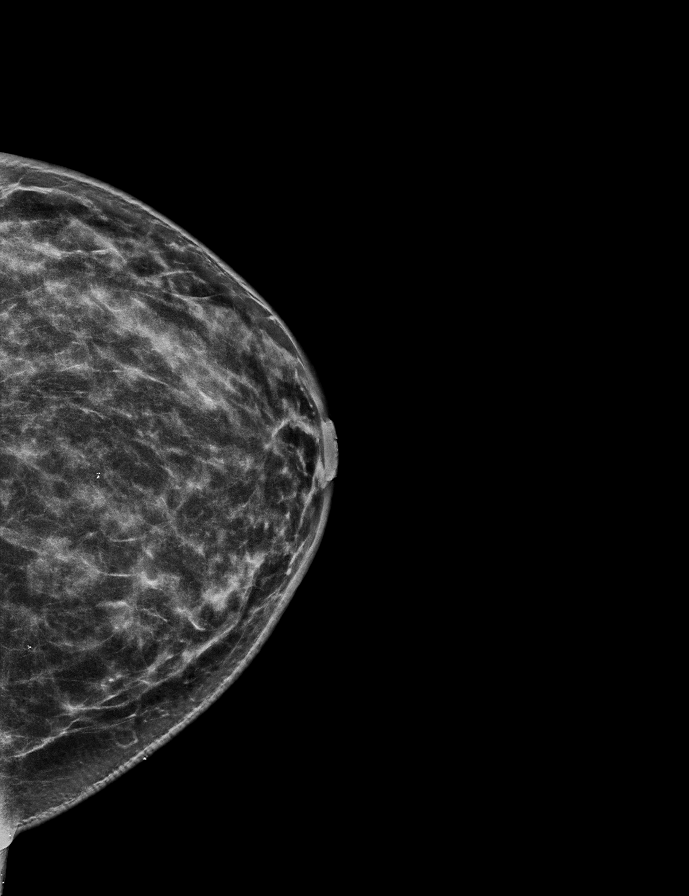

[L MLO tomo · 2 of 72 frames shown]
[frame 24/72]
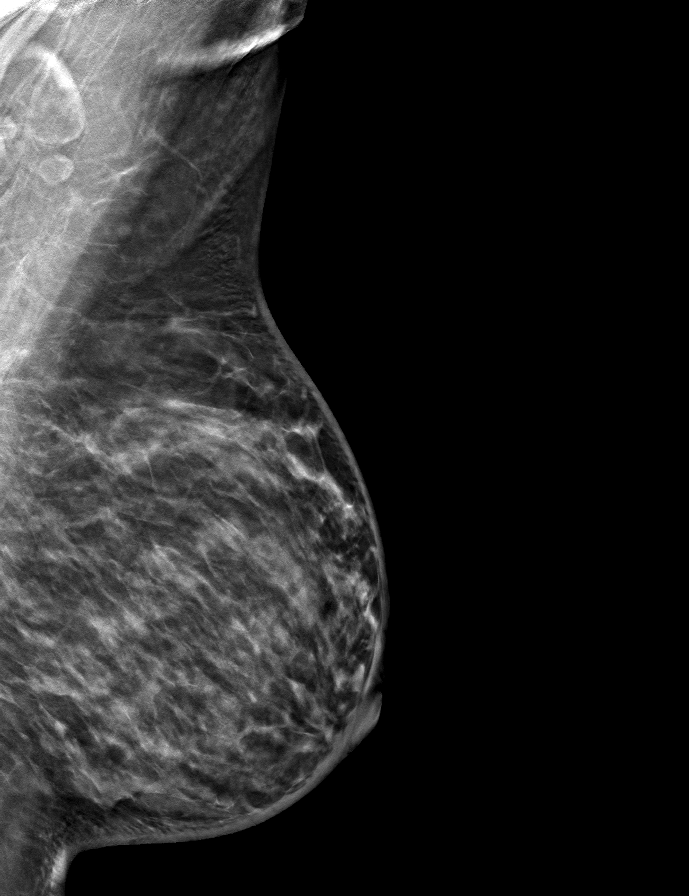
[frame 37/72]
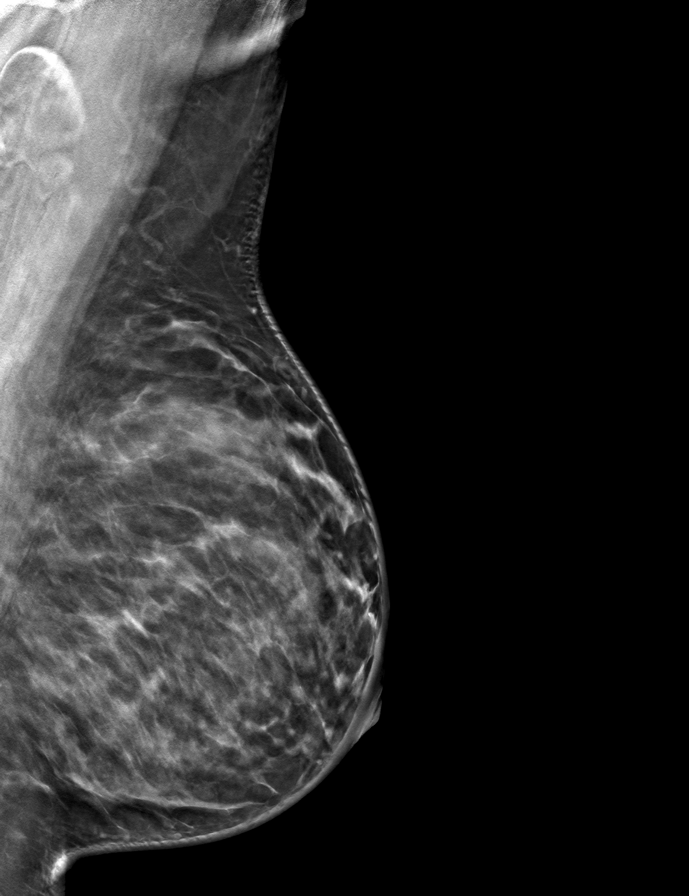

[R MLO tomo · tomo slice 37/72.0]
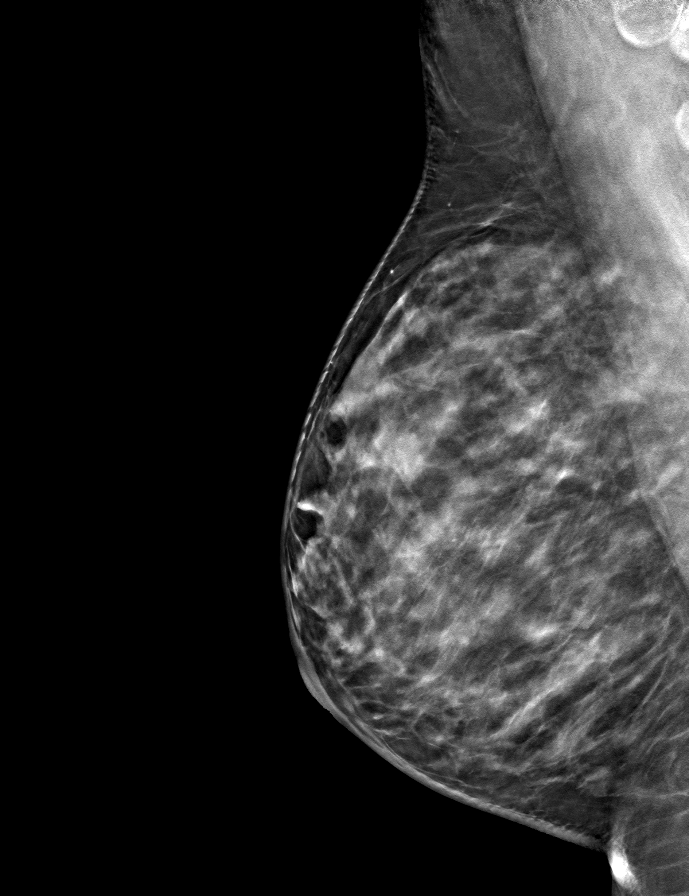

[R CC tomo · tomo slice 36/71.0]
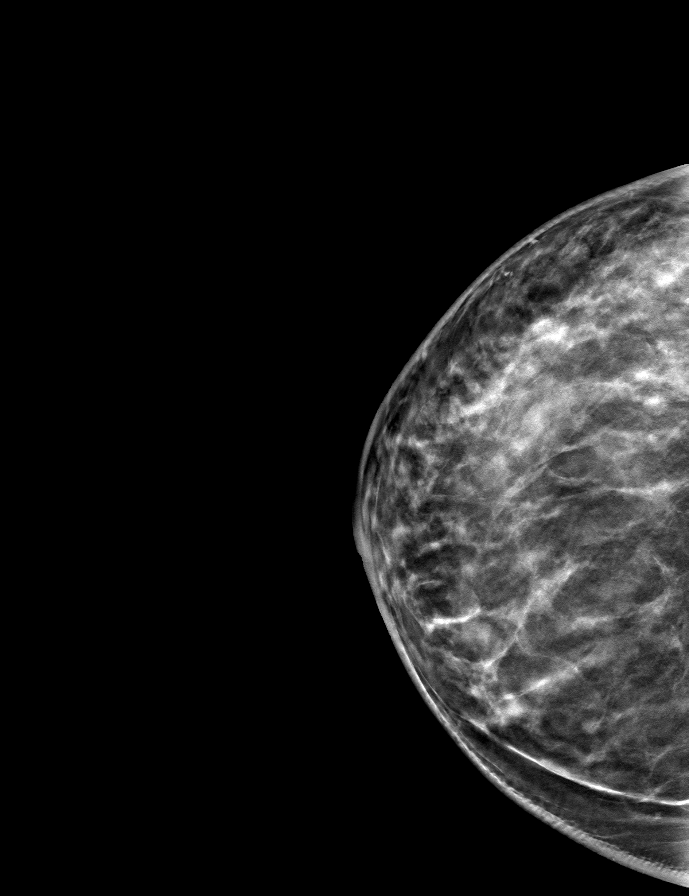

[L CC tomo · tomo slice 33/65.0]
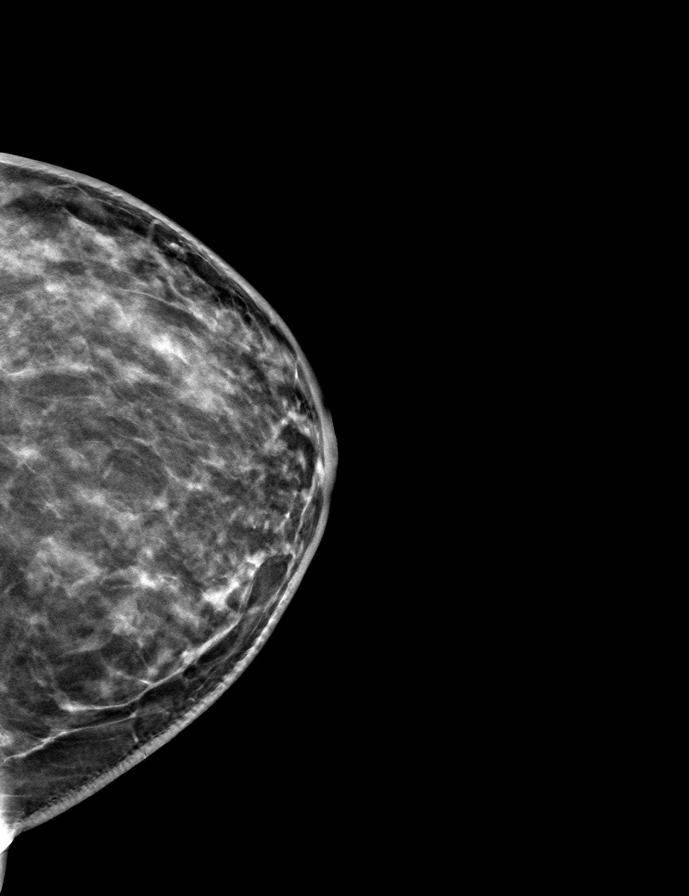

[9 of 24 positions shown; findings below may reference images not displayed]

ACR Breast Density Category b: There are scattered areas of
fibroglandular density.
FINDINGS: There are no findings suspicious for malignancy. Images were
processed with CAD.
IMPRESSION: No mammographic evidence of malignancy. A result letter of this
screening mammogram will be mailed directly to the patient.

RECOMMENDATION:
Screening mammogram in one year. (Code:CN-U-775)

BI-RADS CATEGORY  1: Negative.

## 2020-08-13 ENCOUNTER — Other Ambulatory Visit: Payer: Self-pay | Admitting: Obstetrics & Gynecology

## 2020-08-30 ENCOUNTER — Ambulatory Visit (INDEPENDENT_AMBULATORY_CARE_PROVIDER_SITE_OTHER): Payer: 59 | Admitting: Obstetrics & Gynecology

## 2020-08-30 ENCOUNTER — Encounter: Payer: Self-pay | Admitting: Obstetrics & Gynecology

## 2020-08-30 ENCOUNTER — Other Ambulatory Visit: Payer: Self-pay

## 2020-08-30 ENCOUNTER — Other Ambulatory Visit (HOSPITAL_COMMUNITY)
Admission: RE | Admit: 2020-08-30 | Discharge: 2020-08-30 | Disposition: A | Discharge status: Home / Self Care | Payer: 59 | Source: Ambulatory Visit | Attending: Obstetrics & Gynecology | Admitting: Obstetrics & Gynecology

## 2020-08-30 VITALS — BP 130/82 | Ht 66.0 in | Wt 203.0 lb

## 2020-08-30 DIAGNOSIS — N870 Mild cervical dysplasia: Secondary | ICD-10-CM | POA: Insufficient documentation

## 2020-08-30 DIAGNOSIS — Z1231 Encounter for screening mammogram for malignant neoplasm of breast: Secondary | ICD-10-CM

## 2020-08-30 DIAGNOSIS — L709 Acne, unspecified: Secondary | ICD-10-CM | POA: Diagnosis not present

## 2020-08-30 DIAGNOSIS — Z01419 Encounter for gynecological examination (general) (routine) without abnormal findings: Secondary | ICD-10-CM

## 2020-08-30 DIAGNOSIS — N812 Incomplete uterovaginal prolapse: Secondary | ICD-10-CM

## 2020-08-30 MED ORDER — YAZ 3-0.02 MG PO TABS: 1.0000 | ORAL_TABLET | Freq: Every day | ORAL | 3 refills | Status: DC

## 2020-08-30 NOTE — Progress Notes (Signed)
HPI:      [name] is a 50 y.o. [id number] who LMP was No LMP recorded. (Menstrual status: Oral contraceptives)., she presents today for her annual examination. The patient has no complaints today. The patient is sexually active. Her last pap: approximate date 03/2020 and was normal and prior PAP/ Bx was LGSIL/ CIN I; and last mammogram: approximate date 08/2019 and was normal. The patient does perform self breast exams.  There is no notable family history of breast or ovarian cancer in her family.  The patient has regular exercise: yes.  The patient denies current symptoms of depression.    GYN History: Contraception: vasectomy  PMHx: Past Medical History:  Diagnosis Date   Acne    Past Surgical History:  Procedure Laterality Date   AUGMENTATION MAMMAPLASTY     BREAST EXCISIONAL BIOPSY Left 2012   benign x2 with Dr Evette Cristal   SALPINGOOPHORECTOMY     TONSILLECTOMY     Family History  Problem Relation Age of Onset   Breast cancer Maternal Aunt 60   Breast cancer Cousin        maternal side   Breast cancer Cousin        maternal side   Social History   Tobacco Use   Smoking status: Never Smoker   Smokeless tobacco: Never Used  Vaping Use   Vaping Use: Never used  Substance Use Topics   Alcohol use: No   Drug use: No    Current Outpatient Medications:    lisinopril (ZESTRIL) 2.5 MG tablet, Take 1 tablet by mouth daily., Disp: , Rfl:    fenofibrate 160 MG tablet, Take by mouth., Disp: , Rfl:    fenofibrate 160 MG tablet, Take by mouth., Disp: , Rfl:    YAZ 3-0.02 MG tablet, Take 1 tablet by mouth daily., Disp: 84 tablet, Rfl: 3 Allergies: Vicodin [hydrocodone-acetaminophen]  Review of Systems  Constitutional: Negative for chills, fever and malaise/fatigue.  HENT: Negative for congestion, sinus pain and sore throat.   Eyes: Negative for blurred vision and pain.  Respiratory: Negative for cough and wheezing.   Cardiovascular: Negative for chest pain  and leg swelling.  Gastrointestinal: Negative for abdominal pain, constipation, diarrhea, heartburn, nausea and vomiting.  Genitourinary: Negative for dysuria, frequency, hematuria and urgency.  Musculoskeletal: Negative for back pain, joint pain, myalgias and neck pain.  Skin: Negative for itching and rash.  Neurological: Negative for dizziness, tremors and weakness.  Endo/Heme/Allergies: Does not bruise/bleed easily.  Psychiatric/Behavioral: Negative for depression. The patient is not nervous/anxious and does not have insomnia.     Objective: BP 130/82    Ht 5\' 6"  (1.676 m)    Wt 203 lb (92.1 kg)    BMI 32.77 kg/m   Filed Weights   08/30/20 1440  Weight: 203 lb (92.1 kg)   Body mass index is 32.77 kg/m. Physical Exam Constitutional:      General: She is not in acute distress.    Appearance: She is well-developed and well-nourished.  Genitourinary:     Bladder, vagina, uterus, rectum and urethral meatus normal.     There is no rash or lesion on the right labia.     There is no rash or lesion on the left labia.    No lesions in the vagina.     No vaginal bleeding.      Right Adnexa: not tender and no mass present.    Left Adnexa: not tender and no mass present.  No cervical motion tenderness, friability, lesion or polyp.     Uterus is mobile.     Uterus is not enlarged.     No uterine mass detected.    Uterus exam comments: Mobile, gr 2 POP.     Uterus is midaxial and midaxial.     Bladder exam comments: Cystocele gr1 .     Pelvic exam was performed with patient in the lithotomy position.  Breasts:     Right: No mass, skin change or tenderness.     Left: No mass, skin change or tenderness.    HENT:     Head: Normocephalic and atraumatic. No laceration.     Right Ear: Hearing normal.     Left Ear: Hearing normal.     Nose: No epistaxis or foreign body.     Mouth/Throat:     Mouth: Oropharynx is clear and moist and mucous membranes are normal.     Pharynx: Uvula  midline.  Eyes:     Pupils: Pupils are equal, round, and reactive to light.  Neck:     Thyroid: No thyromegaly.  Cardiovascular:     Rate and Rhythm: Normal rate and regular rhythm.     Heart sounds: No murmur heard. No friction rub. No gallop.   Pulmonary:     Effort: Pulmonary effort is normal. No respiratory distress.     Breath sounds: Normal breath sounds. No wheezing.  Abdominal:     General: Bowel sounds are normal. There is no distension.     Palpations: Abdomen is soft.     Tenderness: There is no abdominal tenderness. There is no rebound.  Musculoskeletal:        General: Normal range of motion.     Cervical back: Normal range of motion and neck supple.  Neurological:     Mental Status: She is alert and oriented to person, place, and time.     Cranial Nerves: No cranial nerve deficit.  Skin:    General: Skin is warm and dry.  Psychiatric:        Mood and Affect: Mood and affect normal.        Judgment: Judgment normal.  Vitals reviewed.     Assessment:  ANNUAL EXAM 1. Women's annual routine gynecological examination   2. CIN I (cervical intraepithelial neoplasia I)   3. Encounter for screening mammogram for malignant neoplasm of breast   4. Acne, unspecified acne type   5. Uterovaginal prolapse, incomplete      Screening Plan:            1.  Cervical Screening-  Pap smear done today F/u One Year if normnal  2. Breast screening- Exam annually and mammogram>40 planned   3. Colonoscopy every 10 years, Hemoccult testing - after age 58  4. Labs managed by PCP  5. Counseling for contraception: vasectomy   6. Acne, unspecified acne type - Main reason for OCPs Also controls periods, mood - YAZ 3-0.02 MG tablet; Take 1 tablet by mouth daily.  Dispense: 84 tablet; Refill: 3   5. Uterovaginal prolapse, incomplete - no intervention as no sx's at this time    F/U  Return in about 6 months (around 02/27/2021) for Follow up.  Annamarie Major, MD, [facility], [facility] 08/30/2020  3:09 PM

## 2020-08-30 NOTE — Patient Instructions (Signed)
PAP every 6 months °Mammogram every year °   Call [phone] to schedule at [place] °Colonoscopy every 10 years °Labs yearly (with PCP) ° °Thank you for choosing [facility]. As part of our ongoing efforts to improve patient experience, we would appreciate your feedback. Please fill out the short survey that you will receive by mail or MyChart. Your opinion is important to us! °- Dr. Nishant Schrecengost ° °

## 2020-09-02 LAB — CYTOLOGY - PAP

## 2020-09-02 NOTE — Progress Notes (Signed)
Left message for pt to return call.

## 2020-09-02 NOTE — Progress Notes (Signed)
Let her know PAP is low grade abnormal again, and recommend repeat PAP appointment in 6 months

## 2020-09-02 NOTE — Progress Notes (Signed)
Pt aware.

## 2020-11-01 ENCOUNTER — Other Ambulatory Visit: Payer: Self-pay

## 2020-11-01 ENCOUNTER — Encounter: Payer: Self-pay | Admitting: Obstetrics & Gynecology

## 2020-11-01 ENCOUNTER — Ambulatory Visit
Admission: RE | Admit: 2020-11-01 | Discharge: 2020-11-01 | Disposition: A | Discharge status: Home / Self Care | Payer: 59 | Source: Ambulatory Visit | Attending: Obstetrics & Gynecology | Admitting: Obstetrics & Gynecology

## 2020-11-01 ENCOUNTER — Other Ambulatory Visit: Payer: Self-pay | Admitting: Obstetrics & Gynecology

## 2020-11-01 DIAGNOSIS — Z1231 Encounter for screening mammogram for malignant neoplasm of breast: Secondary | ICD-10-CM | POA: Diagnosis not present

## 2021-06-14 ENCOUNTER — Telehealth: Payer: Self-pay | Admitting: Obstetrics & Gynecology

## 2021-06-14 DIAGNOSIS — L709 Acne, unspecified: Secondary | ICD-10-CM

## 2021-06-15 NOTE — Telephone Encounter (Signed)
Called and left voicemail for patient to call back to be scheduled. 

## 2021-06-15 NOTE — Telephone Encounter (Signed)
Needs appt for PAP follow up

## 2021-06-16 NOTE — Telephone Encounter (Signed)
Voicemail is full unable to leave message

## 2021-09-02 ENCOUNTER — Other Ambulatory Visit: Payer: Self-pay | Admitting: Obstetrics & Gynecology

## 2021-09-02 DIAGNOSIS — L709 Acne, unspecified: Secondary | ICD-10-CM

## 2021-09-09 ENCOUNTER — Other Ambulatory Visit: Payer: Self-pay

## 2021-09-09 ENCOUNTER — Ambulatory Visit (INDEPENDENT_AMBULATORY_CARE_PROVIDER_SITE_OTHER): Payer: 59 | Admitting: Obstetrics & Gynecology

## 2021-09-09 ENCOUNTER — Other Ambulatory Visit (HOSPITAL_COMMUNITY)
Admission: RE | Admit: 2021-09-09 | Discharge: 2021-09-09 | Disposition: A | Discharge status: Home / Self Care | Payer: 59 | Source: Ambulatory Visit | Attending: Obstetrics & Gynecology | Admitting: Obstetrics & Gynecology

## 2021-09-09 ENCOUNTER — Encounter: Payer: Self-pay | Admitting: Obstetrics & Gynecology

## 2021-09-09 VITALS — BP 130/80 | Ht 66.0 in | Wt 202.0 lb

## 2021-09-09 DIAGNOSIS — N870 Mild cervical dysplasia: Secondary | ICD-10-CM | POA: Diagnosis present

## 2021-09-09 DIAGNOSIS — Z01419 Encounter for gynecological examination (general) (routine) without abnormal findings: Secondary | ICD-10-CM

## 2021-09-09 DIAGNOSIS — L709 Acne, unspecified: Secondary | ICD-10-CM | POA: Diagnosis not present

## 2021-09-09 MED ORDER — YAZ 3-0.02 MG PO TABS: 1.0000 | ORAL_TABLET | Freq: Every day | ORAL | 3 refills | Status: DC

## 2021-09-09 NOTE — Progress Notes (Signed)
? ?HPI: ?     [name] is a 51 y.o. [id number] who LMP was No LMP recorded. (Menstrual status: Oral contraceptives)., she presents today for her annual examination. The patient has no complaints today. The patient is sexually active. Her last pap: approximate date 2022 and was abnormal: LGSIL; prior PAP normal, and prior to that she had CIN I by [facility];   and last mammogram: approximate date 2022 and was normal. The patient does perform self breast exams.  There is no notable family history of breast or ovarian cancer in her family.  The patient has regular exercise: yes.  The patient denies current symptoms of depression.   ? ?GYN History: ?Contraception: vasectomy ? ?PMHx: ?Past Medical History:  ?Diagnosis Date  ? Acne   ? ?Past Surgical History:  ?Procedure Laterality Date  ? AUGMENTATION MAMMAPLASTY    ? BREAST EXCISIONAL BIOPSY Left 2012  ? benign x2 with Dr Evette Cristal  ? SALPINGOOPHORECTOMY    ? TONSILLECTOMY    ? ?Family History  ?Problem Relation Age of Onset  ? Breast cancer Maternal Aunt 60  ? Breast cancer Cousin   ?     maternal side  ? Breast cancer Cousin   ?     maternal side  ? ?Social History  ? ?Tobacco Use  ? Smoking status: Never  ? Smokeless tobacco: Never  ?Vaping Use  ? Vaping Use: Never used  ?Substance Use Topics  ? Alcohol use: No  ? Drug use: No  ? ? ?Current Outpatient Medications:  ?  fenofibrate 160 MG tablet, Take 1 tablet by mouth daily., Disp: , Rfl:  ?  lisinopril (ZESTRIL) 2.5 MG tablet, Take 1 tablet by mouth daily., Disp: , Rfl:  ?  metFORMIN (GLUCOPHAGE-XR) 500 MG 24 hr tablet, Take 1 tablet by mouth daily with breakfast., Disp: , Rfl:  ?  fenofibrate 160 MG tablet, Take by mouth., Disp: , Rfl:  ?  fenofibrate 160 MG tablet, Take by mouth., Disp: , Rfl:  ?  YAZ 3-0.02 MG tablet, Take 1 tablet by mouth daily., Disp: 84 tablet, Rfl: 3 ?Allergies: Vicodin [hydrocodone-acetaminophen] ? ?Review of Systems  ?Constitutional:  Negative for chills, fever and malaise/fatigue.  ?HENT:   Negative for congestion, sinus pain and sore throat.   ?Eyes:  Negative for blurred vision and pain.  ?Respiratory:  Negative for cough and wheezing.   ?Cardiovascular:  Negative for chest pain and leg swelling.  ?Gastrointestinal:  Negative for abdominal pain, constipation, diarrhea, heartburn, nausea and vomiting.  ?Genitourinary:  Negative for dysuria, frequency, hematuria and urgency.  ?Musculoskeletal:  Negative for back pain, joint pain, myalgias and neck pain.  ?Skin:  Negative for itching and rash.  ?Neurological:  Negative for dizziness, tremors and weakness.  ?Endo/Heme/Allergies:  Does not bruise/bleed easily.  ?Psychiatric/Behavioral:  Negative for depression. The patient is not nervous/anxious and does not have insomnia.   ? ?Objective: ?BP 130/80   Ht 5\' 6"  (1.676 m)   Wt 202 lb (91.6 kg)   BMI 32.60 kg/m?   ?Filed Weights  ? 09/09/21 0848  ?Weight: 202 lb (91.6 kg)  ? Body mass index is 32.6 kg/m?11/09/21 ?Physical Exam ?Constitutional:   ?   General: She is not in acute distress. ?   Appearance: She is well-developed.  ?Genitourinary:  ?   Bladder, rectum and urethral meatus normal.  ?   No lesions in the vagina.  ?   Right Labia: No rash, tenderness or lesions. ?  Left Labia: No tenderness, lesions or rash. ?   No vaginal bleeding.  ? ?   Right Adnexa: not tender and no mass present. ?   Left Adnexa: not tender and no mass present. ?   No cervical motion tenderness, friability, lesion or polyp.  ?   Uterus is not enlarged.  ?   No uterine mass detected. ?   Pelvic exam was performed with patient in the lithotomy position.  ?Breasts: ?   Right: No mass, skin change or tenderness.  ?   Left: No mass, skin change or tenderness.  ?HENT:  ?   Head: Normocephalic and atraumatic. No laceration.  ?   Right Ear: Hearing normal.  ?   Left Ear: Hearing normal.  ?   Mouth/Throat:  ?   Pharynx: Uvula midline.  ?Eyes:  ?   Pupils: Pupils are equal, round, and reactive to light.  ?Neck:  ?   Thyroid: No thyromegaly.   ?Cardiovascular:  ?   Rate and Rhythm: Normal rate and regular rhythm.  ?   Heart sounds: No murmur heard. ?  No friction rub. No gallop.  ?Pulmonary:  ?   Effort: Pulmonary effort is normal. No respiratory distress.  ?   Breath sounds: Normal breath sounds. No wheezing.  ?Abdominal:  ?   General: Bowel sounds are normal. There is no distension.  ?   Palpations: Abdomen is soft.  ?   Tenderness: There is no abdominal tenderness. There is no rebound.  ?Musculoskeletal:     ?   General: Normal range of motion.  ?   Cervical back: Normal range of motion and neck supple.  ?Neurological:  ?   Mental Status: She is alert and oriented to person, place, and time.  ?   Cranial Nerves: No cranial nerve deficit.  ?Skin: ?   General: Skin is warm and dry.  ?Psychiatric:     ?   Judgment: Judgment normal.  ?Vitals reviewed.  ? ? ?Assessment:  ANNUAL EXAM ?1. Women's annual routine gynecological examination   ?2. CIN I (cervical intraepithelial neoplasia I)   ?3. Acne, unspecified acne type   ? ? ? ?Screening Plan: ?           ?1.  Cervical Screening-  Pap smear done today ? ?2. Breast screening- Exam annually and mammogram>40 planned  ? ?3. Colonoscopy every 10 years, Hemoccult testing - after age 33 ? ?4. Labs managed by PCP ? ?5. Counseling for contraception: vasectomy ? ?6. Acne, unspecified acne type ?Dianah Field helps ? ? ? ?  F/U ? Return for and Annual when due. ? ?Tara Major, MD, FACOG ?[facility], [facility] ?09/09/2021  9:14 AM ? ? ?

## 2021-09-13 LAB — CYTOLOGY - PAP: Adequacy: ABSENT

## 2021-09-15 ENCOUNTER — Encounter: Payer: Self-pay | Admitting: Obstetrics & Gynecology

## 2021-11-21 ENCOUNTER — Telehealth: Payer: Self-pay

## 2021-11-21 NOTE — Telephone Encounter (Signed)
Called Marvin back letting her know I can see her Yaz on file where she has 3 refills for a year.

## 2021-11-21 NOTE — Telephone Encounter (Signed)
[name] called triage line about her Tara Landry being on file.

## 2021-11-30 ENCOUNTER — Other Ambulatory Visit: Payer: Self-pay

## 2021-11-30 DIAGNOSIS — L709 Acne, unspecified: Secondary | ICD-10-CM

## 2021-11-30 NOTE — Telephone Encounter (Signed)
Received a fax for a refill of Yaz , Pt has 3 refills on file. Called pt to see if she needs refills or does she need it to another pharmacy, the refill request came from [facility] in [place]. Called pt no answer and voice mailbox full

## 2021-12-02 ENCOUNTER — Other Ambulatory Visit: Payer: Self-pay

## 2021-12-02 DIAGNOSIS — L709 Acne, unspecified: Secondary | ICD-10-CM

## 2021-12-02 MED ORDER — YAZ 3-0.02 MG PO TABS: 1.0000 | ORAL_TABLET | Freq: Every day | ORAL | 2 refills | Status: AC
